# Patient Record
Sex: Female | Born: 1950 | Race: White | Hispanic: No | Marital: Single | Smoking: Never smoker
Health system: Southern US, Community
[De-identification: ages and names within clinical notes are randomized; demographics above are authoritative.]

## PROBLEM LIST (undated history)

## (undated) DIAGNOSIS — I10 Essential (primary) hypertension: Secondary | ICD-10-CM

## (undated) HISTORY — PX: JOINT REPLACEMENT: SHX530

---

## 2016-08-27 ENCOUNTER — Encounter (HOSPITAL_COMMUNITY): Payer: Self-pay | Admitting: Emergency Medicine

## 2016-08-27 ENCOUNTER — Emergency Department (HOSPITAL_COMMUNITY): Payer: BLUE CROSS/BLUE SHIELD

## 2016-08-27 ENCOUNTER — Emergency Department (HOSPITAL_COMMUNITY)
Admission: EM | Admit: 2016-08-27 | Discharge: 2016-08-27 | Disposition: A | Discharge status: Home / Self Care | Payer: BLUE CROSS/BLUE SHIELD | Attending: Emergency Medicine | Admitting: Emergency Medicine

## 2016-08-27 DIAGNOSIS — R51 Headache: Secondary | ICD-10-CM | POA: Insufficient documentation

## 2016-08-27 DIAGNOSIS — Z79899 Other long term (current) drug therapy: Secondary | ICD-10-CM | POA: Diagnosis not present

## 2016-08-27 DIAGNOSIS — R590 Localized enlarged lymph nodes: Secondary | ICD-10-CM | POA: Diagnosis not present

## 2016-08-27 DIAGNOSIS — R519 Headache, unspecified: Secondary | ICD-10-CM

## 2016-08-27 DIAGNOSIS — I10 Essential (primary) hypertension: Secondary | ICD-10-CM | POA: Diagnosis not present

## 2016-08-27 DIAGNOSIS — R2243 Localized swelling, mass and lump, lower limb, bilateral: Secondary | ICD-10-CM | POA: Diagnosis present

## 2016-08-27 DIAGNOSIS — R600 Localized edema: Secondary | ICD-10-CM

## 2016-08-27 DIAGNOSIS — R0602 Shortness of breath: Secondary | ICD-10-CM | POA: Diagnosis not present

## 2016-08-27 HISTORY — DX: Essential (primary) hypertension: I10

## 2016-08-27 LAB — I-STAT TROPONIN, ED: Troponin i, poc: 0 ng/mL (ref 0.00–0.08)

## 2016-08-27 LAB — BASIC METABOLIC PANEL
Anion gap: 7 (ref 5–15)
BUN: 14 mg/dL (ref 6–20)
CHLORIDE: 105 mmol/L (ref 101–111)
CO2: 29 mmol/L (ref 22–32)
Calcium: 9.4 mg/dL (ref 8.9–10.3)
Creatinine, Ser: 0.69 mg/dL (ref 0.44–1.00)
GFR calc Af Amer: >60 mL/min (ref >60)
GFR calc non Af Amer: >60 mL/min (ref >60)
GLUCOSE: 102 mg/dL — AB (ref 65–99)
POTASSIUM: 4 mmol/L (ref 3.5–5.1)
Sodium: 141 mmol/L (ref 135–145)

## 2016-08-27 LAB — D-DIMER, QUANTITATIVE: D-Dimer, Quant: 0.72 ug/mL-FEU — ABNORMAL HIGH (ref 0.00–0.50)

## 2016-08-27 LAB — CBC
HEMATOCRIT: 36.2 % (ref 36.0–46.0)
Hemoglobin: 12 g/dL (ref 12.0–15.0)
MCH: 28.7 pg (ref 26.0–34.0)
MCHC: 33.1 g/dL (ref 30.0–36.0)
MCV: 86.6 fL (ref 78.0–100.0)
Platelets: 152 10*3/uL (ref 150–400)
RBC: 4.18 MIL/uL (ref 3.87–5.11)
RDW: 13.2 % (ref 11.5–15.5)
WBC: 5.1 10*3/uL (ref 4.0–10.5)

## 2016-08-27 LAB — BRAIN NATRIURETIC PEPTIDE: B Natriuretic Peptide: 198.9 pg/mL — ABNORMAL HIGH (ref 0.0–100.0)

## 2016-08-27 MED ORDER — HYDROCHLOROTHIAZIDE 25 MG PO TABS: 25.0000 mg | ORAL_TABLET | Freq: Every day | ORAL | 0 refills | Status: AC

## 2016-08-27 MED ORDER — HYDROCHLOROTHIAZIDE 25 MG PO TABS
25.0000 mg | ORAL_TABLET | Freq: Every day | ORAL | Status: DC
  Administered 2016-08-27: 25 mg via ORAL
  Filled 2016-08-27: qty 1

## 2016-08-27 MED ORDER — IOPAMIDOL (ISOVUE-370) INJECTION 76%
INTRAVENOUS | Status: AC
  Administered 2016-08-27: 100 mL
  Filled 2016-08-27: qty 100

## 2016-08-27 MED ORDER — ACETAMINOPHEN 500 MG PO TABS
1000.0000 mg | ORAL_TABLET | Freq: Once | ORAL | Status: AC
  Administered 2016-08-27: 1000 mg via ORAL
  Filled 2016-08-27: qty 2

## 2016-08-27 MED ORDER — DOXYCYCLINE HYCLATE 100 MG PO CAPS: 100.0000 mg | ORAL_CAPSULE | Freq: Two times a day (BID) | ORAL | 0 refills | Status: AC

## 2016-08-27 NOTE — Discharge Instructions (Signed)
You have presented to the emergency department with several different symptoms. Due to recent travel and elevated D-dimer test, a CT PE study of the chest was done. You have been given a copy of this result. Please show this to your doctor.  At a separate emergency department visit, in a different facility system, you were diagnosed with angioedema. For this reason, your valsartan was discontinued. You have also been off of your hydrochlorothiazide which was part of that medication. You have developed some swelling of the legs and your blood pressure is approximately 160/90. Today the plan is to restart her hydrochlorothiazide and continue to monitor your blood pressures. You may need additional blood pressure medication added as well.  You have had some nonspecific, generalized headache and general fatigue. This in conjunction with findings of lymphadenopathy and recent [facility], raised the possibility of tickborne or mosquito borne illness within the differential diagnosis of your condition. After discussing the risks and benefits of empiric antibiotics, we have determined to try a course of doxycycline to see if your symptoms improve.

## 2016-08-27 NOTE — ED Provider Notes (Signed)
[facility]-EMERGENCY DEPT Provider Note   CSN: [id number] Arrival date & time: 08/27/16  1119     History   Chief Complaint Chief Complaint  Patient presents with  . Shortness of Breath  . Headache  . Angioedema    HPI Amber Blackburn is a 66 y.o. female.  HPI Patient has recently been traveling quite a bit with her daughter. They have made driving trips to the [place] region, they just recently returned from [place]. The patient reports that when she was in the [place] she developed some feeling of swelling around her tongue and neck. She had been taking Benadryl. She was seen at an emergency department and diagnosed with angioedema. Of note however patient's daughter could not significantly appreciate the swelling by visual inspection. She does note however her mom speech seemed a little thick at the time that was occurring. She wasn't sure if that has something to do with the amount of Benadryl she had been taking for the symptoms or the actual swelling that was perceived. For this reason the patient's valsartan hydrochlorothiazide was discontinued. Patient has not been taking her blood pressure medication since. Patient notes that she is in the interim been developing several different symptoms. She actually thinks some may have preceded the onset of the tongue swelling. Patient for she is extremely fatigued and felt like she's had generalized swelling particularly in her legs. She reports she's had a persistent headache that is in the forehead and the back of her head. She has not had a documented fever. She has felt like there is warm and generalized thickness through her neck. Patient has not had vomiting or diarrhea. No abdominal pain. He is not seen a rash. Past Medical History:  Diagnosis Date  . Hypertension     There are no active problems to display for this patient.   Past Surgical History:  Procedure Laterality Date  . JOINT REPLACEMENT      OB History    No data  available       Home Medications    Prior to Admission medications   Medication Sig Start Date End Date Taking? Authorizing Provider  acetaminophen (TYLENOL) 325 MG tablet Take 650 mg by mouth every 6 (six) hours as needed for mild pain.   Yes [provider]  diphenhydrAMINE (BENADRYL) 25 MG tablet Take 25 mg by mouth every 6 (six) hours as needed for allergies.   Yes [provider]  gabapentin (NEURONTIN) 100 MG capsule Take 100 mg by mouth 3 (three) times daily.   Yes [provider]  meloxicam (MOBIC) 7.5 MG tablet Take 7.5 mg by mouth daily.   Yes [provider]  simethicone (MYLICON) 125 MG chewable tablet Chew 125 mg by mouth every 6 (six) hours as needed for flatulence.   Yes [provider]  simvastatin (ZOCOR) 10 MG tablet Take 10 mg by mouth daily.   Yes [provider]  thyroid (ARMOUR) 60 MG tablet Take 60 mg by mouth daily before breakfast.   Yes [provider]  traMADol (ULTRAM) 50 MG tablet Take 50 mg by mouth 2 (two) times daily as needed for moderate pain.   Yes [provider]  ValACYclovir HCl (VALTREX PO) Take 1 tablet by mouth daily as needed (cold sores).   Yes [provider]  valsartan-hydrochlorothiazide (DIOVAN-HCT) 160-25 MG tablet Take 1 tablet by mouth daily.   Yes [provider]  zolpidem (AMBIEN) 10 MG tablet Take 10 mg by mouth  at bedtime.   Yes [provider]  doxycycline (VIBRAMYCIN) 100 MG capsule Take 1 capsule (100 mg total) by mouth 2 (two) times daily. 08/27/16   Arby BarrettePfeiffer, Kaylea Mounsey, MD  hydrochlorothiazide (HYDRODIURIL) 25 MG tablet Take 1 tablet (25 mg total) by mouth daily. 08/27/16   Arby BarrettePfeiffer, Maron Stanzione, MD    Family History History reviewed. No pertinent family history.  Social History Social History  Substance Use Topics  . Smoking status: Never Smoker  . Smokeless tobacco: Never Used  . Alcohol use No     Allergies   Codeine; Lortab  [hydrocodone-acetaminophen]; Tape; Valsartan-hydrochlorothiazide; and Versed [midazolam]   Review of Systems Review of Systems 10 Systems reviewed and are negative for acute change except as noted in the HPI.   Physical Exam Updated Vital Signs BP (!) 154/83   Pulse 69   Temp 97.6 F (36.4 C) (Oral)   Resp 18   SpO2 98%   Physical Exam  Constitutional: She is oriented to person, place, and time. She appears well-developed and well-nourished. No distress.  Patient is alert and nontoxic.  HENT:  Head: Normocephalic and atraumatic.  Oropharynx is widely patent. There is no observable enlargement of her tongue. No erythema.  Eyes: Conjunctivae and EOM are normal.  Neck: Neck supple.  Patient endorses some discomfort to palpation of the posterior cervical nodes bilaterally but more so to the right and left. No meningismus.  Cardiovascular: Normal rate, regular rhythm, normal heart sounds and intact distal pulses.   No murmur heard. Pulmonary/Chest: Effort normal and breath sounds normal. No respiratory distress.  Abdominal: Soft. She exhibits no distension. There is no tenderness.  Musculoskeletal: Normal range of motion. She exhibits edema.  1+ edema of the ankles and feet bilaterally. Calves tender. No significant erythema.  Neurological: She is alert and oriented to person, place, and time. No cranial nerve deficit. She exhibits normal muscle tone. Coordination normal.  Skin: Skin is warm and dry.  Psychiatric: She has a normal mood and affect.  Nursing note and vitals reviewed.    ED Treatments / Results  Labs (all labs ordered are listed, but only abnormal results are displayed) Labs Reviewed  BASIC METABOLIC PANEL - Abnormal; Notable for the following:       Result Value   Glucose, Bld 102 (*)    All other components within normal limits  BRAIN NATRIURETIC PEPTIDE - Abnormal; Notable for the following:    B Natriuretic Peptide 198.9 (*)    All other components within  normal limits  D-DIMER, QUANTITATIVE (NOT AT [facility]) - Abnormal; Notable for the following:    D-Dimer, Quant 0.72 (*)    All other components within normal limits  CBC  I-STAT TROPONIN, ED    EKG  EKG Interpretation None       Radiology Dg Chest 2 View  Result Date: 08/27/2016 CLINICAL DATA:  Cough, headache, difficulty catching of breath, leg swelling, onset of symptoms on 08/11/2016, worsening cough over last few days, history hypertension EXAM: CHEST  2 VIEW COMPARISON:  None FINDINGS: Normal heart size, mediastinal contours, and pulmonary vascularity. Or atelectasis versus infiltrate at RIGHT base. Remaining lungs clear. No pleural effusion or pneumothorax. Prior laparoscopic gastric band surgery, band poorly visualized. Bones demineralized with levoconvex cervicothoracic scoliosis. IMPRESSION: Mild atelectasis versus infiltrate at RIGHT base. Electronically Signed   By: Ulyses SouthwardMark  Boles M.D.   On: 08/27/2016 11:58   Ct Head Wo Contrast  Result Date: 08/27/2016 CLINICAL DATA:  Headaches for a month, history hypertension  EXAM: CT HEAD WITHOUT CONTRAST TECHNIQUE: Contiguous axial images were obtained from the base of the skull through the vertex without intravenous contrast. Sagittal and coronal MPR images reconstructed from axial data set. COMPARISON:  None FINDINGS: Brain: Normal ventricular morphology. No midline shift or mass effect. Normal appearance of brain parenchyma. No intracranial hemorrhage, mass lesion, evidence of acute infarction, or extra-axial fluid collection. Vascular: Normal appearance Skull: Intact Sinuses/Orbits: Mucosal retention cyst LEFT maxillary sinus. Remaining visualized paranasal sinuses and mastoid air cells clear Other: N/A IMPRESSION: No acute intracranial abnormalities. Electronically Signed   By: Ulyses Southward M.D.   On: 08/27/2016 13:55   Ct Angio Chest Pe W/cm &/or Wo Cm  Result Date: 08/27/2016 CLINICAL DATA:  Shortness of breath and chest pain, 4 weeks  duration. EXAM: CT ANGIOGRAPHY CHEST WITH CONTRAST TECHNIQUE: Multidetector CT imaging of the chest was performed using the standard protocol during bolus administration of intravenous contrast. Multiplanar CT image reconstructions and MIPs were obtained to evaluate the vascular anatomy. CONTRAST:  100 cc Isovue 370 COMPARISON:  Chest radiography same day FINDINGS: Cardiovascular: Pulmonary arterial opacification is excellent. There are no pulmonary emboli. There is aortic atherosclerosis but no aneurysm or dissection. Heart size is normal. No pericardial fluid. No visible coronary artery calcification. Mediastinum/Nodes: Slightly prominent lymph nodes in the mediastinum and hilar regions. Lungs/Pleura: The lungs are clear. No infiltrate, mass, nodule, collapse or pleural effusion. Upper Abdomen: Previous lap band surgery. No acute upper abdominal finding. 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 No acute bone finding. Review of the MIP images confirms the above findings. IMPRESSION: No pulmonary emboli or acute vascular finding. Lungs are clear. Slightly prominent hilar and mediastinal lymph nodes. In the absence of lung disease, this raises possibility of sarcoid. Other causes of nodal enlargement such as nonspecific systemic diseases or lymphoma are not excluded. Aortic Atherosclerosis (ICD10-I70.0). Electronically Signed   By: Paulina Fusi M.D.   On: 08/27/2016 16:03    Procedures Procedures (including critical care time)  Medications Ordered in ED Medications  hydrochlorothiazide (HYDRODIURIL) tablet 25 mg (25 mg Oral Given 08/27/16 1300)  acetaminophen (TYLENOL) tablet 1,000 mg (1,000 mg Oral Given 08/27/16 1300)  iopamidol (ISOVUE-370) 76 % injection (100 mLs  Contrast Given 08/27/16 1535)     Initial Impression / Assessment and Plan / ED Course  I have reviewed the triage vital signs and the nursing notes.  Pertinent  labs & imaging results that were available during my care of the patient were reviewed by me and considered in my medical decision making (see chart for details).     Final Clinical Impressions(s) / ED Diagnoses   Final diagnoses:  Essential hypertension  Acute nonintractable headache, unspecified headache type  Lower extremity edema  Mediastinal lymphadenopathy   Patient has a constellation of symptoms. She also has had recent travel. Due to a suspected episode of angioedema she has also been taken off her valsartan hydrochlorothiazide. Clinically the patient is nontoxic, alert and without acute respiratory distress. With her travel history, report of dyspnea and lower extremity pain, concern was for PE. CT chest is ruled out PE. She does however have lymphadenopathy of unclear etiology. There is no pneumonia or other suspicious lesions present. Patient has had a persistent frontal and posterior headache but no associated neurologic symptoms and no documented fever or meningismus. She has had recent travel to the [facility] and [place]. Consideration was given to tick borne or mosquito borne illness within  the differential diagnosis. Without meningismus, fever, rash or other more specific symptoms I did not feel that LP is indicated. He does not have leukocytosis. After discussion of risks and benefits of empiric antibiotics, patient does wish to proceed with an empiric course of doxycycline. She will also be restarted on her hydrochlorothiazide which had been discontinued in conjunction with the valsartan when she presented to another emergency department for suspected angioedema. Patient is discharged in good condition. She is alert and appropriate. Blood pressure is 160s over 90s without any signs of end organ damage.  New Prescriptions New Prescriptions   DOXYCYCLINE (VIBRAMYCIN) 100 MG CAPSULE    Take 1 capsule (100 mg total) by mouth 2 (two) times daily.   HYDROCHLOROTHIAZIDE (HYDRODIURIL) 25  MG TABLET    Take 1 tablet (25 mg total) by mouth daily.     Arby Barrette, MD 08/27/16 1719

## 2016-08-27 NOTE — ED Notes (Signed)
Radiology to bring pt to E48

## 2016-08-27 NOTE — ED Notes (Signed)
Patient to CT.

## 2016-08-27 NOTE — ED Triage Notes (Signed)
Pt sts increased SOB and leg swelling over last several weeks; pt sts seen on 7/6 for angioedema and told to stop BP meds; pt sts still having swelling but some improvement; pt labored at present; pt sts HA

## 2019-03-22 IMAGING — CR DG CHEST 2V
2 series · 2 of 2 positions shown · non-contrast
Comparison: None

CLINICAL DATA: Cough, headache, difficulty catching of breath, leg
swelling, onset of symptoms on 08/11/2016, worsening cough over last
few days, history hypertension

EXAM:
CHEST  2 VIEW

[chest pa]
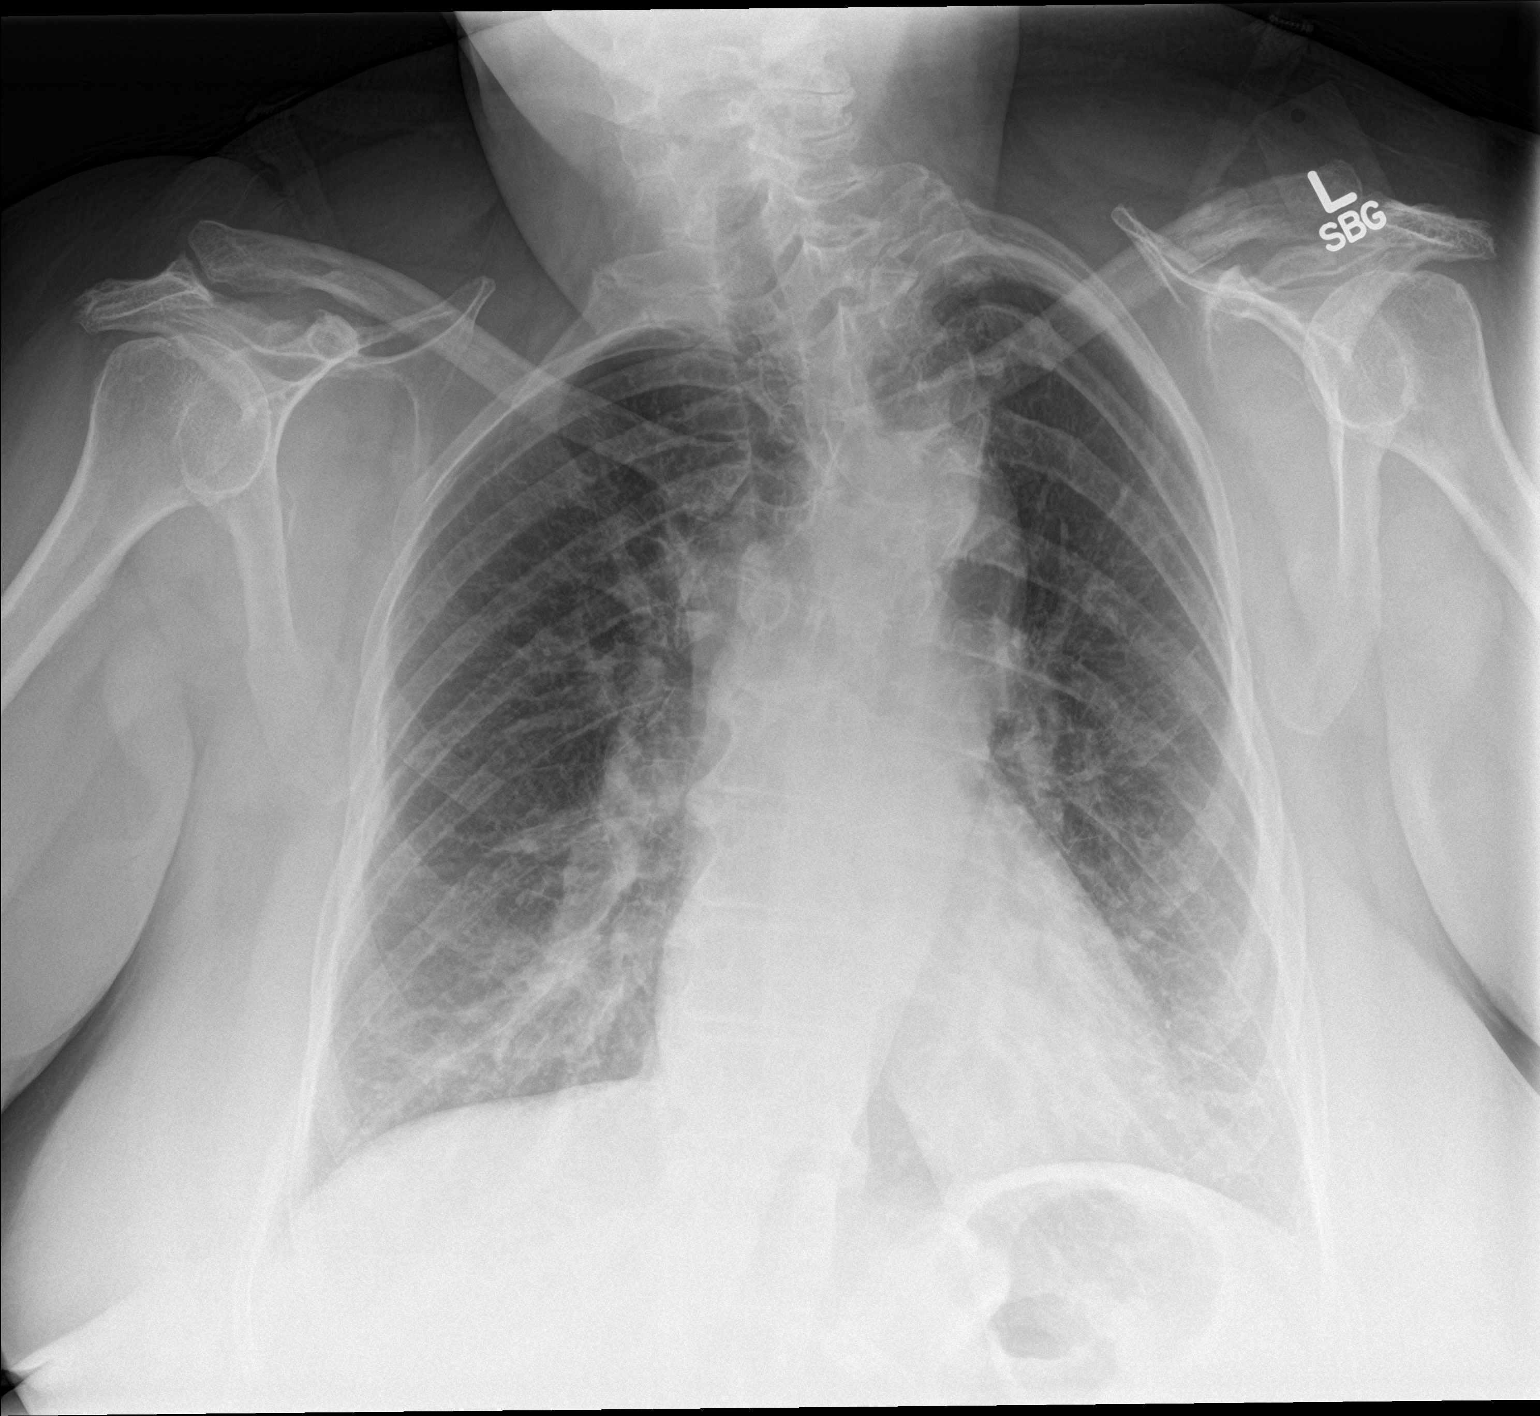

[chest lat]
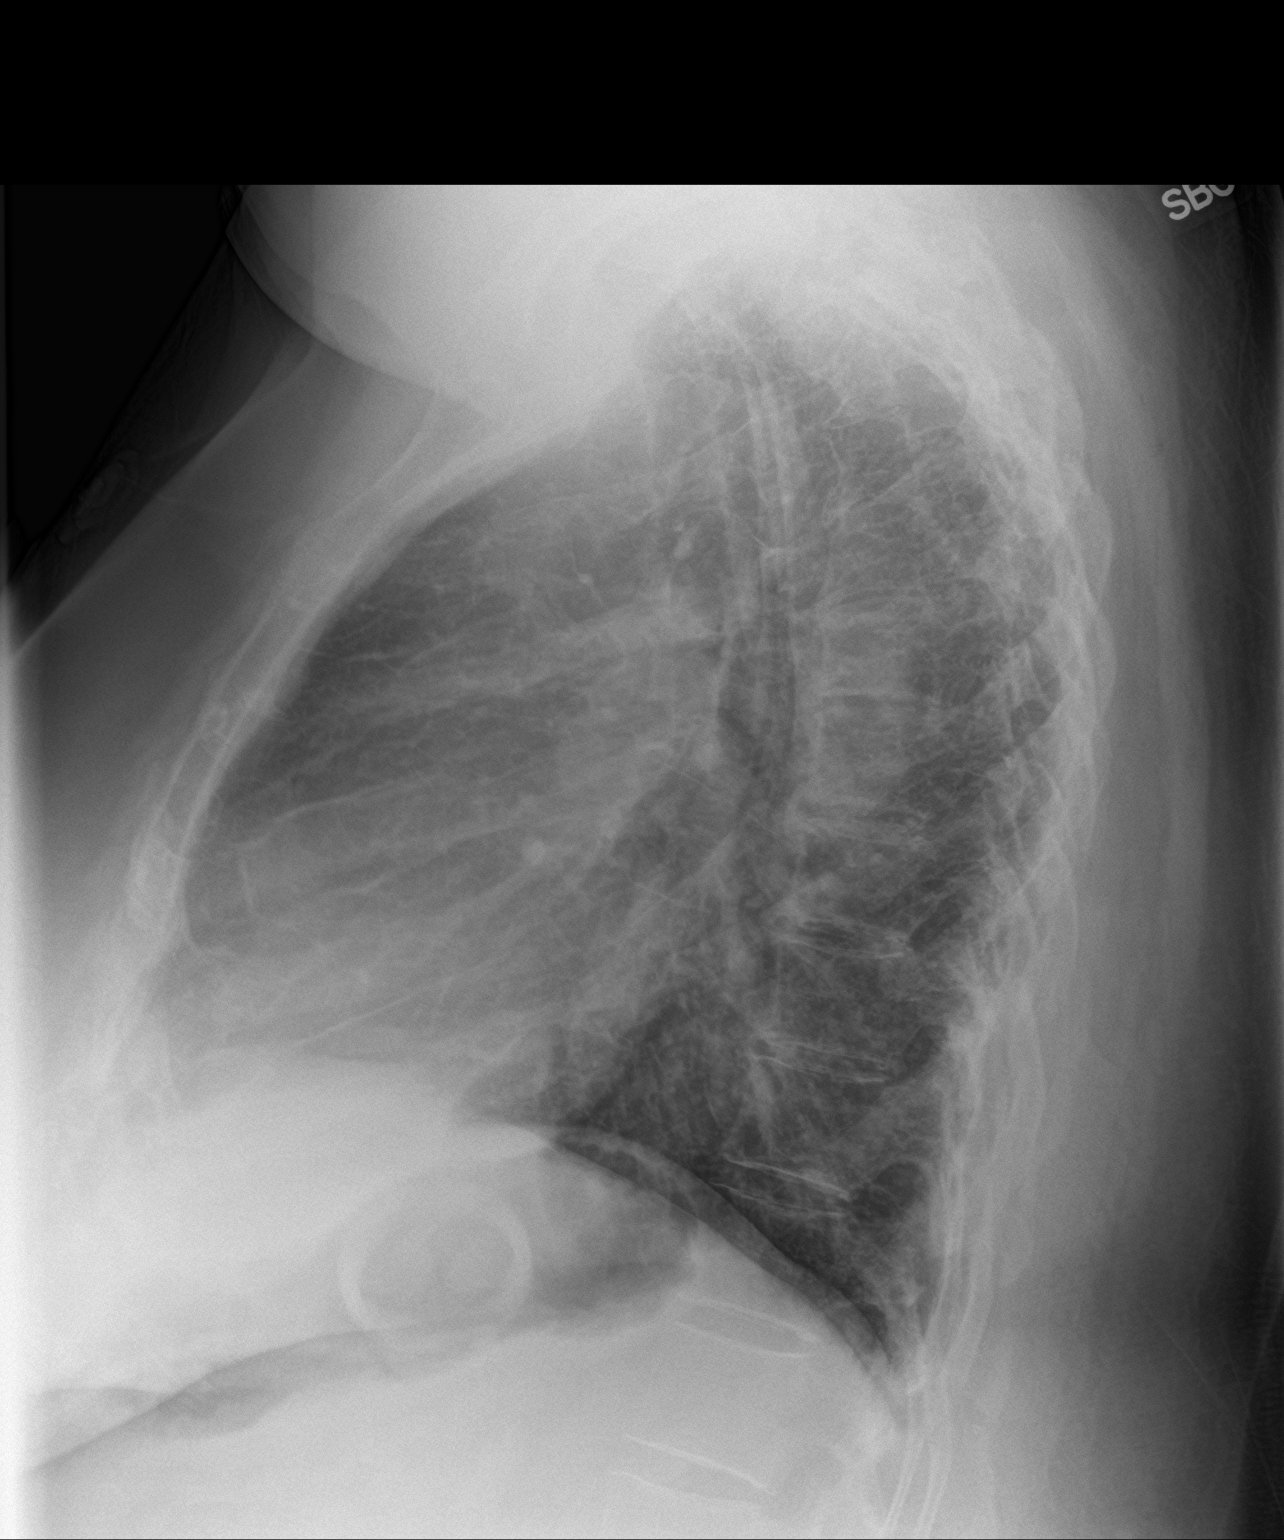

[2 of 2 positions shown; findings below may reference images not displayed]

FINDINGS: Normal heart size, mediastinal contours, and pulmonary vascularity.

Or atelectasis versus infiltrate at RIGHT base.

Remaining lungs clear.

No pleural effusion or pneumothorax.

Prior laparoscopic gastric band surgery, band poorly visualized.

Bones demineralized with levoconvex cervicothoracic scoliosis.
IMPRESSION: Mild atelectasis versus infiltrate at RIGHT base.

## 2019-03-22 IMAGING — CT CT HEAD W/O CM
4 series · 16 of 47 positions shown, 18 images · non-contrast
Comparison: None

CLINICAL DATA: Headaches for a month, history hypertension

EXAM:
CT HEAD WITHOUT CONTRAST
TECHNIQUE: Contiguous axial images were obtained from the base of the skull
through the vertex without intravenous contrast. Sagittal and
coronal MPR images reconstructed from axial data set.

[Series 3: head wo · axial · 0.44mm/px · z∈[-74,+46]mm · 7 of 33 slices shown, 9 images]
[im 5/33  brain]
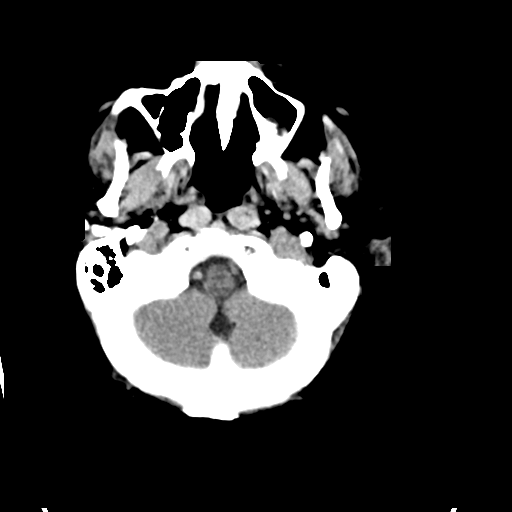
[im 5/33  bone]
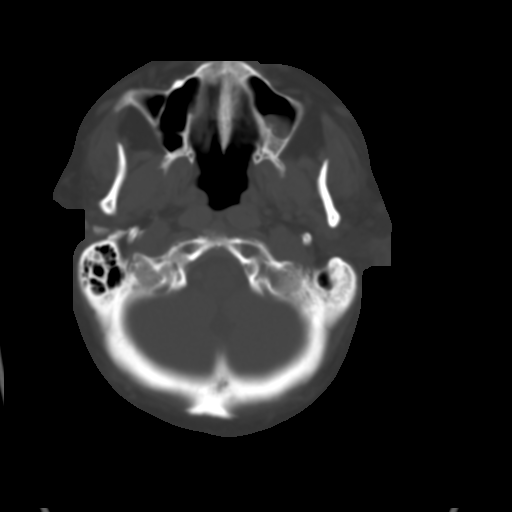
[im 9/33  brain]
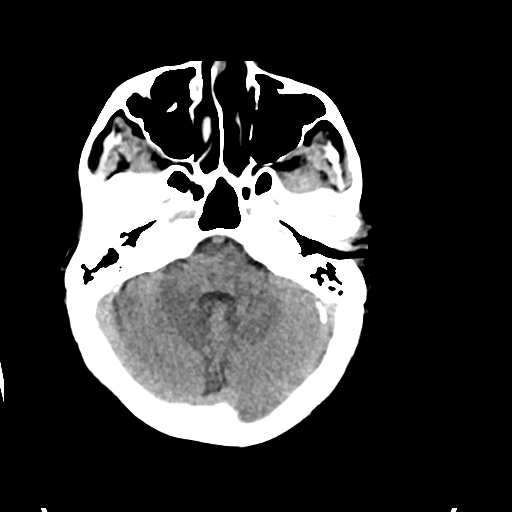
[im 13/33  brain]
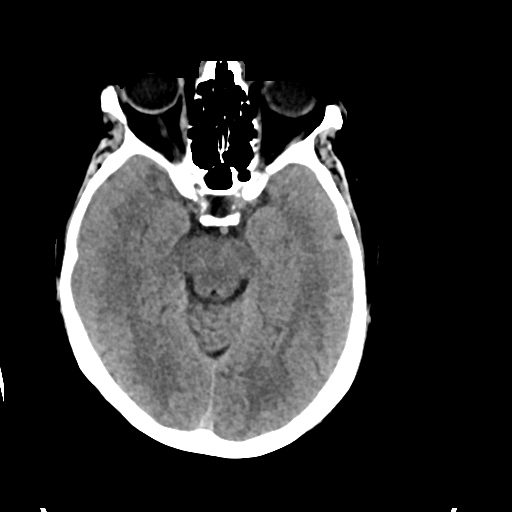
[im 17/33  brain]
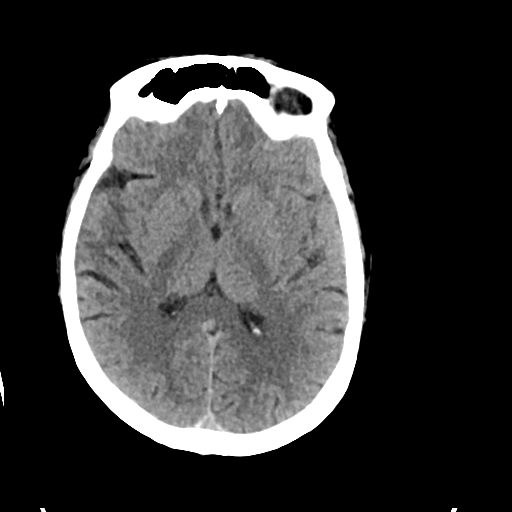
[im 21/33  brain]
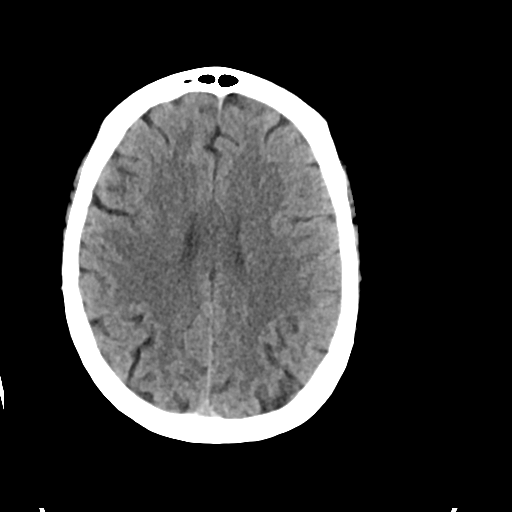
[im 21/33  bone]
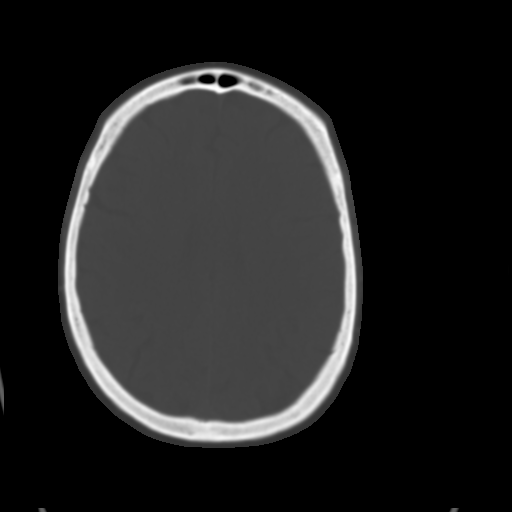
[im 25/33  brain]
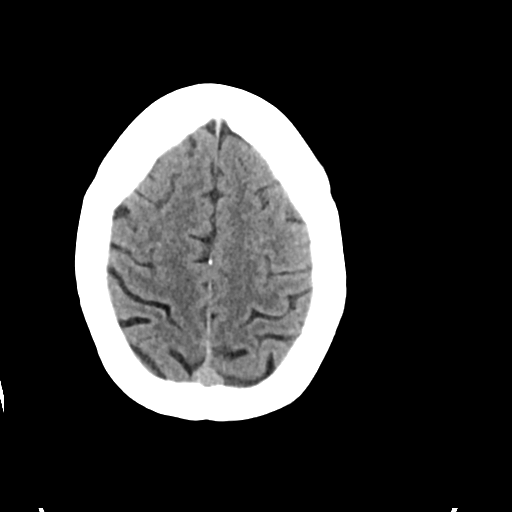
[im 29/33  brain]
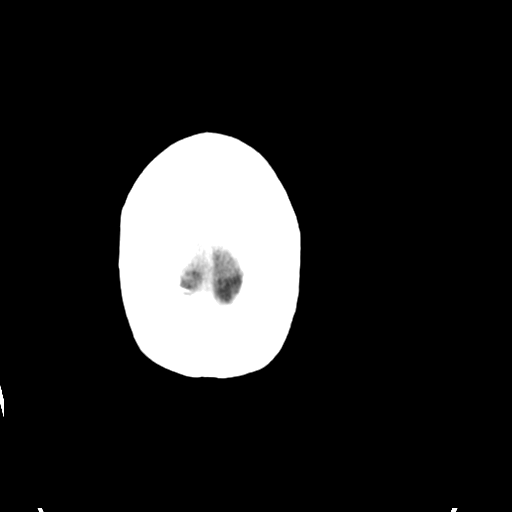

[Series 4: head bone · axial · 0.44mm/px · z∈[-78,-46]mm · 3 of 83 slices shown]
[im 9/83  bone]
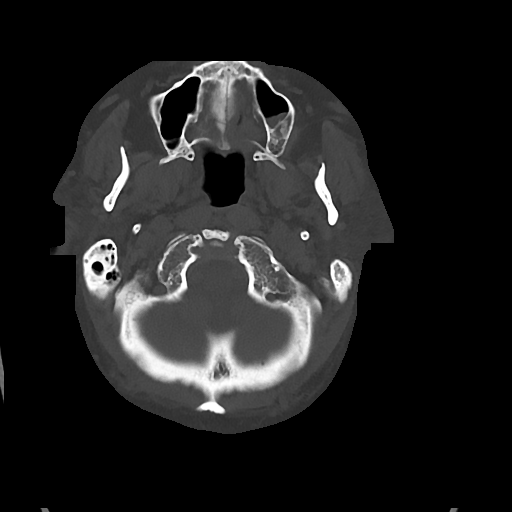
[im 17/83  bone]
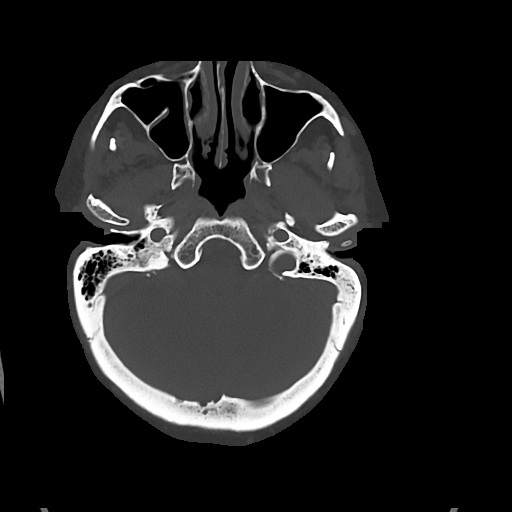
[im 25/83  bone]
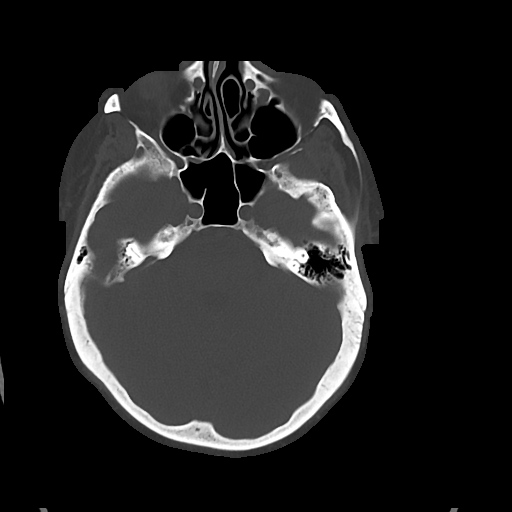

[Series 5: cor soft · coronal · 0.30mm/px · 3 of 68 slices shown]
[im 23/68  brain]
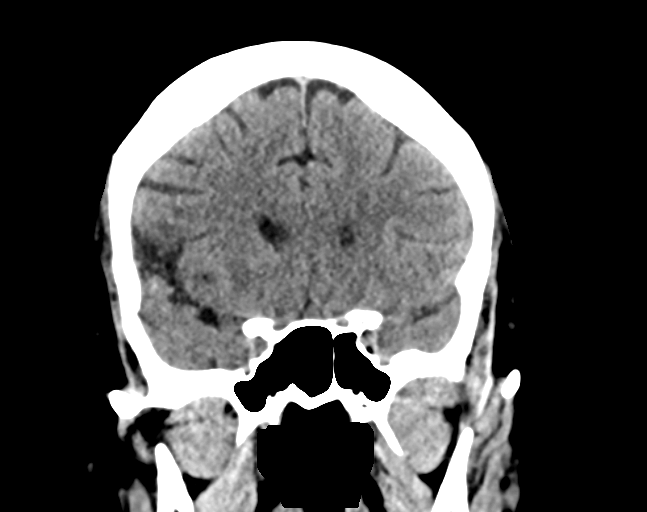
[im 30/68  brain]
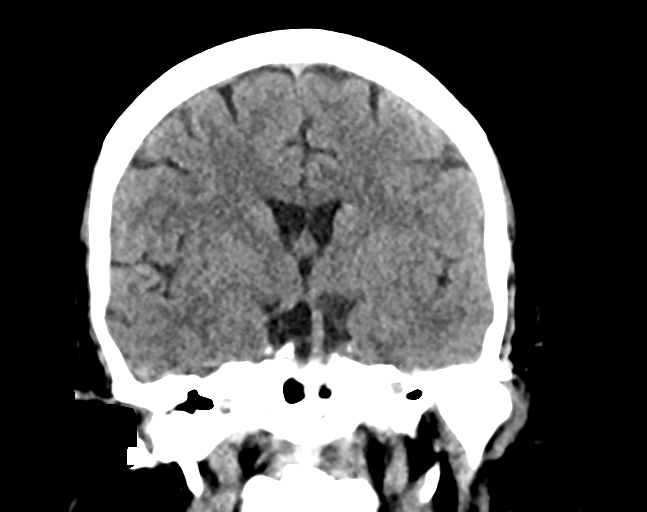
[im 38/68  brain]
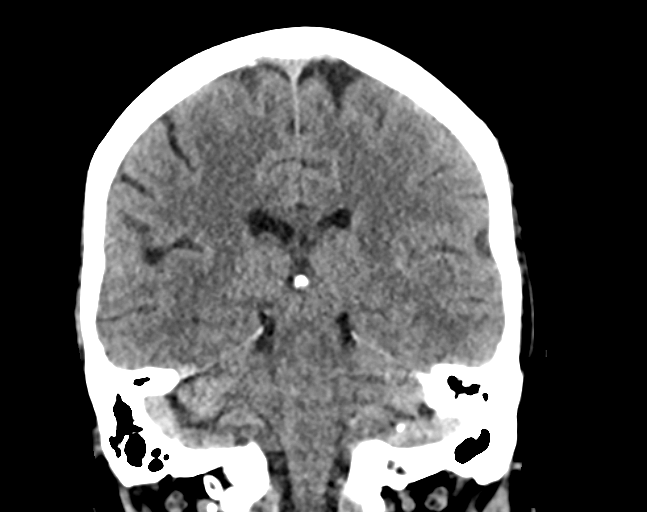

[Series 6: sag soft · sagittal · 0.36mm/px · 3 of 67 slices shown]
[im 23/67  brain]
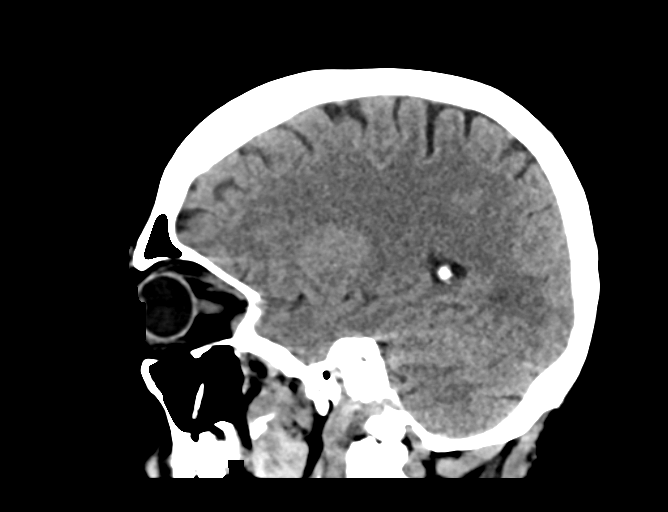
[im 34/67  brain]
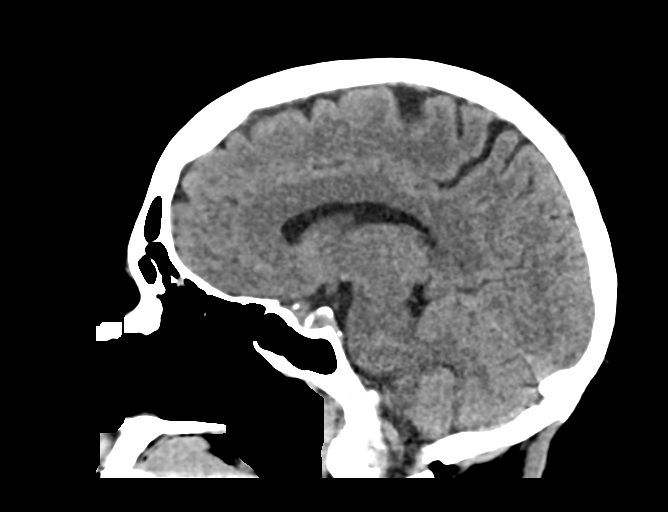
[im 45/67  brain]
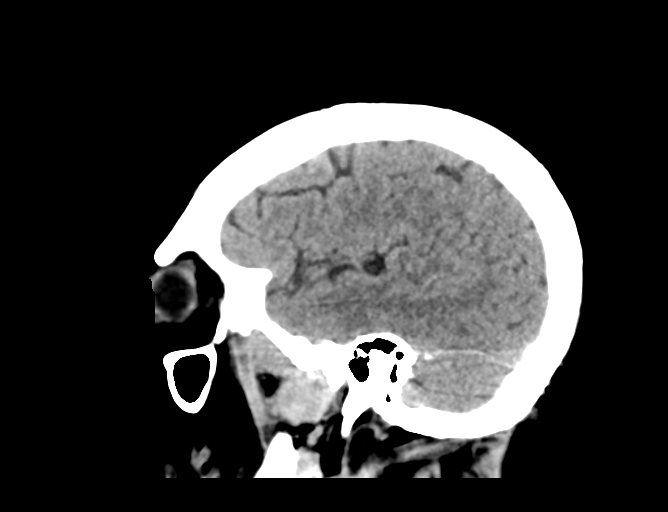

[16 of 47 positions shown; findings below may reference images not displayed]

FINDINGS: Brain: Normal ventricular morphology. No midline shift or mass
effect. Normal appearance of brain parenchyma. No intracranial
hemorrhage, mass lesion, evidence of acute infarction, or
extra-axial fluid collection.

Vascular: Normal appearance

Skull: Intact

Sinuses/Orbits: Mucosal retention cyst LEFT maxillary sinus.
Remaining visualized paranasal sinuses and mastoid air cells clear

Other: N/A
IMPRESSION: No acute intracranial abnormalities.
